# Patient Record
Sex: Female | Born: 1975 | Race: White | Hispanic: No | Marital: Married | Smoking: Never smoker
Health system: Southern US, Community
[De-identification: ages and names within clinical notes are randomized; demographics above are authoritative.]

## PROBLEM LIST (undated history)

## (undated) DIAGNOSIS — H3530 Unspecified macular degeneration: Secondary | ICD-10-CM

## (undated) DIAGNOSIS — Z8616 Personal history of COVID-19: Secondary | ICD-10-CM

## (undated) HISTORY — DX: Unspecified macular degeneration: H35.30

## (undated) HISTORY — DX: Personal history of COVID-19: Z86.16

---
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
## 1999-01-17 ENCOUNTER — Other Ambulatory Visit: Admission: RE | Admit: 1999-01-17 | Discharge: 1999-01-17 | Payer: Self-pay | Admitting: Gynecology

## 1999-08-03 ENCOUNTER — Encounter (INDEPENDENT_AMBULATORY_CARE_PROVIDER_SITE_OTHER): Payer: Self-pay

## 1999-08-03 ENCOUNTER — Inpatient Hospital Stay (HOSPITAL_COMMUNITY): Admission: AD | Admit: 1999-08-03 | Discharge: 1999-08-05 | Payer: Self-pay | Admitting: Gynecology

## 1999-08-06 ENCOUNTER — Encounter: Admission: RE | Admit: 1999-08-06 | Discharge: 1999-09-05 | Payer: Self-pay | Admitting: Gynecology

## 1999-09-15 ENCOUNTER — Other Ambulatory Visit: Admission: RE | Admit: 1999-09-15 | Discharge: 1999-09-15 | Payer: Self-pay | Admitting: Gynecology

## 2000-10-25 ENCOUNTER — Other Ambulatory Visit: Admission: RE | Admit: 2000-10-25 | Discharge: 2000-10-25 | Payer: Self-pay | Admitting: Gynecology

## 2002-01-02 ENCOUNTER — Other Ambulatory Visit: Admission: RE | Admit: 2002-01-02 | Discharge: 2002-01-02 | Payer: Self-pay | Admitting: Gynecology

## 2004-02-13 ENCOUNTER — Other Ambulatory Visit: Admission: RE | Admit: 2004-02-13 | Discharge: 2004-02-13 | Payer: Self-pay | Admitting: Obstetrics and Gynecology

## 2004-05-07 ENCOUNTER — Ambulatory Visit: Payer: Self-pay | Admitting: Family Medicine

## 2004-06-03 ENCOUNTER — Ambulatory Visit: Payer: Self-pay | Admitting: Family Medicine

## 2004-07-15 ENCOUNTER — Encounter: Admission: RE | Admit: 2004-07-15 | Discharge: 2004-07-31 | Payer: Self-pay | Admitting: Obstetrics and Gynecology

## 2004-09-03 ENCOUNTER — Inpatient Hospital Stay (HOSPITAL_COMMUNITY): Admission: AD | Admit: 2004-09-03 | Discharge: 2004-09-03 | Payer: Self-pay | Admitting: Obstetrics and Gynecology

## 2004-09-05 ENCOUNTER — Encounter (INDEPENDENT_AMBULATORY_CARE_PROVIDER_SITE_OTHER): Payer: Self-pay | Admitting: Specialist

## 2004-09-05 ENCOUNTER — Inpatient Hospital Stay (HOSPITAL_COMMUNITY): Admission: AD | Admit: 2004-09-05 | Discharge: 2004-09-08 | Payer: Self-pay | Admitting: *Deleted

## 2004-11-18 ENCOUNTER — Ambulatory Visit: Payer: Self-pay | Admitting: Internal Medicine

## 2005-09-25 ENCOUNTER — Ambulatory Visit: Payer: Self-pay | Admitting: Family Medicine

## 2005-12-23 ENCOUNTER — Other Ambulatory Visit: Admission: RE | Admit: 2005-12-23 | Discharge: 2005-12-23 | Payer: Self-pay | Admitting: Obstetrics and Gynecology

## 2006-06-17 ENCOUNTER — Ambulatory Visit: Payer: Self-pay | Admitting: Family Medicine

## 2006-06-18 ENCOUNTER — Ambulatory Visit: Payer: Self-pay | Admitting: Family Medicine

## 2006-06-18 ENCOUNTER — Encounter: Payer: Self-pay | Admitting: Family Medicine

## 2007-05-31 ENCOUNTER — Encounter (INDEPENDENT_AMBULATORY_CARE_PROVIDER_SITE_OTHER): Payer: Self-pay | Admitting: Obstetrics and Gynecology

## 2007-05-31 ENCOUNTER — Ambulatory Visit (HOSPITAL_COMMUNITY): Admission: RE | Admit: 2007-05-31 | Discharge: 2007-05-31 | Payer: Self-pay | Admitting: Obstetrics and Gynecology

## 2009-02-20 ENCOUNTER — Ambulatory Visit: Payer: Self-pay | Admitting: Family Medicine

## 2010-04-21 ENCOUNTER — Emergency Department: Payer: Self-pay | Admitting: Emergency Medicine

## 2010-07-29 NOTE — Op Note (Signed)
NAMEADRIAN, Janet Lucas              ACCOUNT NO.:  [id number]   MEDICAL RECORD NO.:  [id number]          PATIENT TYPE:  AMB   LOCATION:  SDC                           [facility]   PHYSICIAN:  Osborn Coho, M.D.   DATE OF BIRTH:  Jul 12, 1975   DATE OF PROCEDURE:  DATE OF DISCHARGE:                               OPERATIVE REPORT   PREOPERATIVE DIAGNOSIS:  Menometrorrhagia.   POSTOPERATIVE DIAGNOSIS:  Menometrorrhagia.   PROCEDURES:  1. Hysteroscopy.  2. Dilatation and curettage.  3. NovaSure endometrial ablation.   ATTENDING:  Osborn Coho, M.D.   ANESTHESIA:  General via LMA.   FINDINGS:  Polypoid tissue.   SPECIMENS TO PATHOLOGY:  Endometrial curettings.  Frozen section sent  and pathology report was benign with secretary endometrium.  Permanent  sections sent as well and still pending.   FLUIDS:  500 mL   URINE OUTPUT:  Quantity sufficient via straight catheter prior to  procedure.   ESTIMATED BLOOD LOSS:  Minimal.   COMPLICATIONS:  None.   PROCEDURE:  The patient was taken to the operating room after the risks,  benefits and alternatives reviewed with the patient, the patient  verbalized understanding and consent signed and witnessed.  The patient  was placed under general anesthesia and prepped and draped in the normal  sterile fashion.  A bivalve speculum was placed in the patient's vagina  and the anterior lip of the cervix grasped with single-tooth tenaculum.  A paracervical block was administered using a total of 10 mL of 1%  lidocaine.  The cervical length measured 5 cm and the uterus sounded to  10 cm.  The cervix was dilated for passage of the hysteroscope,  hysteroscope introduced, and no obvious intracavitary lesions noted.  Curettage was performed and several polypoid-appearing pieces of tissue  were removed and sent to pathology.  Once frozen section was obtained,  NovaSure ablation was performed and again the uterine sound length was  10 cm, the  cervical length was 5 cm, the cavity length was 5 cm, the  cavity width was 4.7 cm.  The power was 129 watts and ablation was  performed for 90 seconds.  The cervix had been dilated for passage of  the  NovaSure instrument and after ablation, hysteroscope was reintroduced  and good results noted.  All instruments were removed and there was good  hemostasis at the tenaculum site.  Count was correct.  The patient  tolerated the procedure well and was awaiting transfer to the recovery  room in good condition.      Osborn Coho, M.D.  Electronically Signed     AR/MEDQ  D:  05/31/2007  T:  05/31/2007  Job:  [id number]

## 2010-08-01 NOTE — H&P (Signed)
Janet Lucas, Janet Lucas              ACCOUNT NO.:  [id number]   MEDICAL RECORD NO.:  [id number]          PATIENT TYPE:  MAT   LOCATION:  MATC                          [facility]  [facility]   PHYSICIAN:  Janine Limbo, M.D.DATE OF BIRTH:  June 18, 1975   DATE OF ADMISSION:  09/05/2004  DATE OF DISCHARGE:                                HISTORY & PHYSICAL   [name] is a 35 year old, gravida 2, para 1-0-0-1, at 38-2/7th weeks who  presents in labor.  The patient is scheduled for an elective primary  cesarean section, on September 08, 2004, secondary to difficult first labor, LGA  infant, and third degree laceration.  The patient reports onset of labor  approximately 4:30 p.m. this afternoon.  Her cervix has been 2-cm in the  office.   Her pregnancy has been remarkable for:  1.  Insulin-dependent gestational diabetes.  2.  Positive Group B strep.  3.  Planned elective cesarean section secondary to difficult labor and      delivery.  4.  History of LGA infant.   PRENATAL LABS:  Blood type is A positive.  Rh antibody negative.  VDRL  nonreactive.  Rubella titer positive.  Hepatitis B surface antigen negative.  HIV is declined.  GC and Chlamydia cultures were negative in November.  Platelet count was 328 at her new OB visit, hemoglobin upon entry into  practice was 12.5.  It was 11.7 at 28 weeks.  The patient had an elevated 1  hour Glucola of 163.  She had an abnormal 3-hour GTT and was diagnosed with  gestational diabetes at approximately 30 weeks.  She was diet-controlled for  a time but then began to take NPH at bedtime.  She then was added an NPH  dose at lunch and her evening dose was also increased.  Most recent sonogram  showed estimated fetal weight of 7.5 pounds on approximately September 01, 2004.   In reviewing delivery options with the patient, Dr. Estanislado Pandy had discussed  with the patient history of an LGA infant with forceps delivery, shoulder  dystocia, and a third degree laceration.   The patient elected to proceed  with a scheduled primary cesarean section.  She had been 2-cm seen in the  office earlier this week and reports increased contractions today.  She  denies any leaking of fluid.  EDC of September 16, 2004, was established last  menstrual period and was in agreement with ultrasound at approximately 18  weeks.  The patient has been managed with NSTs twice weekly during the later  stage of her pregnancy.   OBSTETRICAL HISTORY:  In 2001, she had a vaginal birth of a female infant,  weighed 8 pounds 14 ounces at 40 weeks' gestation.  She was in labor 15  hours.  She had epidural anesthesia.  She had a prolonged second stage,  forceps delivery, meconium aspiration, third degree laceration with vaginal  pain persisting, and moderate shoulder dystocia.   MEDICAL HISTORY:  1.  The patient is unsure of some type of infection postpartum but was      probably bacterial vaginosis.  2.  She was on Ortho Tri-Cyclen before her first child and then had to use      withdrawal method.  3.  She reports the usual childhood illnesses.  4.  She has had wisdom teeth removed in 2003.  5.  She had a cyst on her right breast in the past.   She denies any allergies.   FAMILY HISTORY:  Her maternal grandmother had an MI and is now deceased.  Her father had an MI and is deceased.  The patient's father had [facility] Spotted Fever with neurological complications, diagnosed with  bipolar and schizophrenia subsequent to [facility] Fever.  He  committed suicide.  Her father also had hypertension.  Her maternal  grandmother had varicosities.  Mother had anemia.  The patient also had  anemia with pregnancy.  Paternal aunt, paternal cousin had diabetes.  Father  had questionable Parkinson's but there is some question of medication  induced.  Maternal aunt had lung cancer.  Mother had cervical cancer and had  a hysterectomy.  Father was diagnosed, at age 50, with bipolar and   schizophrenia, after a [facility] Spotted Fever tick bite.  Her son has  severe asthma.   GENETIC HISTORY:  Remarkable for the patient's nephew born with a missing  thumb and a half an esophagus.  The patient is a twin, brother is a twin,  maternal uncles are twins, maternal grandmother is a twin, and paternal  cousins are twins.   SOCIAL HISTORY:  The patient is married to the father of the baby.  He is  involved and supportive.  His name is Ludd Harrison.  The patient is college  educated.  She is a Systems analyst.  Her husband has a high school  education.  He is employed with a trucking company.  She has been followed  originally by the Certified Nurse Midwife service but then was transferred  to the Physician's service secondary to gestational diabetes.  She denies  any alcohol, drug, or tobacco use during this pregnancy.  She is Caucasian  of the [place] faith.   PHYSICAL EXAMINATION:  VITAL SIGNS:  Stable.  The patient is afebrile.  HEENT:  Within normal limits.  LUNGS:  Breath sounds are clear.  HEART:  Regular rate and rhythm without murmur.  BREASTS:  Soft and nontender.  ABDOMEN:  Fundal height is approximately 38-cm.  Estimated fetal weight is 7-  8 pounds.  Uterine contractions are every 3-4 minutes, moderate quality.  PELVIC:  Cervical exam 4-cm, 100%, vertex, at -1 station with an intact bag  of water.  Fetal heart rate is reassuring with no decelerations.  EXTREMITIES:  Deep tendon reflexes are 2+ without clonus.  There is a trace  edema noted.   IMPRESSION:  1.  Intrauterine pregnancy at 38-4/7th weeks.  2.  Scheduled for cesarean section, on September 08, 2004, secondary to a history      of difficult delivery.  3.  Early labor.  4.  Positive Group B strep.  5.  Insulin-dependent gestational diabetic.  6.  Desires tubal sterilization.   PLAN:  1.  Admit to the [facility] for a consult with Dr.      Janine Limbo as attending      physician.  2.  Routine preoperative orders.  3.  Check comprehensive metabolic panel, CBC  , RPR.       VLL/MEDQ  D:  09/05/2004  T:  09/05/2004  Job:  [id number] 

## 2010-08-01 NOTE — Op Note (Signed)
Janet Lucas              ACCOUNT NO.:  [id number]   MEDICAL RECORD NO.:  [id number]          PATIENT TYPE:  INP   LOCATION:  9101                          [facility]ACILITY:  [facility]   PHYSICIAN:  Janine Limbo, M.D.DATE OF BIRTH:  [birth year]   DATE OF PROCEDURE:  09/05/2004  DATE OF DISCHARGE:                                 OPERATIVE REPORT   PREOPERATIVE DIAGNOSES:  1.  Term intrauterine pregnancy.  2.  History of a macrosomic infant with shoulder dystocia and meconium      aspiration.  3.  Desires sterilization.  4.  Gestational diabetes.   POSTOPERATIVE DIAGNOSIS:  1.  Term intrauterine pregnancy.  2.  History of a macrosomic infant with shoulder dystocia and meconium      aspiration.  3.  Desires sterilization.  4.  Gestational diabetes.  5.  Meconium-stained amniotic fluid.   PROCEDURES:  1.  Primary low transverse cesarean section.  2.  Bilateral tubal ligation.   SURGEON:  Janine Limbo, M.D.   FIRST ASSISTANT:  Nigel Bridgeman, C.N.M.   ANESTHETIC:  Spinal.   DISPOSITION:  Janet Lucas is a 35 year old female, gravida 2, para 1-0-0-1,  who presents with the above-mentioned diagnoses.  The patient understands  the indications for her surgical procedure and she accepts the risk of, but  not limited to, anesthetic complications, bleeding, infection, possible  damage to surrounding organs, and possible tubal failure (17 per 1000).   FINDINGS:  A 7 pounds 7 ounce female infant [employer])  was delivered from a  cephalic presentation.  The apparatus for 8 at one minute and nine at five .  The fallopian tubes, the ovaries, and the uterus were normal for the gravid  state.  Meconium fluid was noted, but no meconium was noted beneath the  vocal cords.   PROCEDURE:  The patient was taken to the operating room, where a spinal  anesthetic was given.  The patient's abdomen and perineum were prepped with  multiple layers of Betadine.  A Foley catheter was placed in the  bladder.  The patient was then sterilely draped.  The lower abdomen was injected with  20 mL of 0.5% Marcaine with epinephrine.  A low transverse incision was made  and carried sharply through the subcutaneous tissue, the fascia, and the  anterior peritoneum.  The bladder flap was developed.  An incision was made  in the lower uterine segment and extended transversely.  Meconium fluid was  noted.  The fetal head was delivered.  The infant's mouth and nose were suctioned  using the DeLee trap.  The remainder of the infant was then delivered.  The  cord was clamped and cut and the infant was handed to the waiting pediatric  team.  Routine cord blood studies were obtained.  The placenta was removed.  The uterine cavity was cleaned of amniotic fluid, clotted blood, and  membranes.  The uterine incision was closed using a running locking suture  of 2-0 Vicryl followed by an imbricating suture of 2-0 Vicryl.  Hemostasis was adequate.  The left fallopian tube was identified and  followed  to its fimbriated end.  A knuckle of tube was made on the left  using a free tie of 0 plain catgut.  A suture ligature of 0 plain catgut was  then placed.  The knuckle of tube thus made was excised.  Hemostasis was  adequate.  An identical procedure was carried out on the opposite side.  Again hemostasis was adequate.  All instruments were then removed.  The  anterior peritoneum was closed using a running suture of 2-0 Vicryl.  The  abdominal musculature was reapproximated in the midline using 2-0 Vicryl.  The fascia, the subcutaneous tissue, and abdominal musculature were  irrigated.  Hemostasis was adequate.  The fascia was closed using a running  suture of 0 Vicryl followed by three interrupted sutures of 0 Vicryl.  A  Jackson-Pratt drain was placed in the subcutaneous layer and brought out  through the left lower quadrant.  It was sutured into place using 3-0 silk.  The subcutaneous layer was closed using  interrupted sutures of 0 Vicryl.  The skin was reapproximated using a subcuticular suture of 4-0 Monocryl.  Sponge, needle, and instrument counts were correct on two occasions.  The  estimated blood loss for the procedure was 800 mL.  The patient tolerated  her procedure well.  The patient was taken to the recovery room in stable  condition.  The infant was taken to the full-term nursery in stable  condition.       AVS/MEDQ  D:  09/05/2004  T:  09/06/2004  Job:  [id number]

## 2010-08-01 NOTE — Discharge Summary (Signed)
Janet Lucas, Janet Lucas              ACCOUNT NO.:  [id number]   MEDICAL RECORD NO.:  [id number]          PATIENT TYPE:  INP   LOCATION:  9101                          [facility]   PHYSICIAN:  Janine Limbo, M.D.DATE OF BIRTH:  Feb 06, 1976   DATE OF ADMISSION:  09/05/2004  DATE OF DISCHARGE:  09/08/2004                                 DISCHARGE SUMMARY   ADMITTING DIAGNOSES:  1.  Intrauterine pregnancy at term.  2.  History of macrosomia with shoulder dystocia.  3.  Desired sterilization.  4.  Gestational diabetes.   DISCHARGE DIAGNOSES:  1.  Intrauterine pregnancy at term.  2.  History of macrosomia with shoulder dystocia.  3.  Desired sterilization.  4.  Gestational diabetes.  5.  Meconium stained fluid.  6.  Status post cesarean delivery of a viable female infant named Samera      weighing 7 pounds 7 ounces, Apgars 8 and 9.   PROCEDURES:  1.  Spinal anesthesia.  2.  Primary low transverse cesarean section.  3.  Elective bilateral tubal ligation.   HOSPITAL COURSE:  Patient was admitted for evaluation of labor.  She had had  a cesarean section planned for June 26 and therefore cesarean section was  undertaken on date of admission.  This was done under spinal anesthesia.  She also had a bilateral tubal ligation at the same time.  EBL was 800 mL.  Dr. Stefano Gaul was surgeon.  On postoperative day #1 patient was doing well,  taking clear liquids and voiding well after her Foley was removed.  She did  have some hemorrhoid pain.  Hemoglobin was 11.6.  On postoperative day #2  patient continued to improve.  She received routine postoperative care.  Blood sugars ranged 91-111 on 6 units of NPH insulin at bedtime.  On  postoperative day #3 patient was doing well.  Vital signs were stable.  She  was afebrile.  Chest was clear.  Heart rate regular rate and rhythm.  Abdomen was soft and appropriately tender.  Incision was clean, dry, and  intact.  The JP drain was draining only 10  mL in the last 24 hours and was  removed without difficulty.  Lochia was small.  Extremities within normal  limits.  Patient was deemed to have received the full benefit of her  hospital stay and was discharged home.   DISCHARGE MEDICATIONS:  1.  Motrin 600 mg one p.o. q.6h. p.r.n.  2.  Tylox one to two p.o. q.4h. p.r.n.  3.  NPH insulin as directed by physician.  4.  Prenatal vitamins.   DISCHARGE LABORATORIES:  Chemistries were within normal limits.  White blood  cell count 13.4, hemoglobin 11.6, platelet count 180.  RPR nonreactive.   DISCHARGE INSTRUCTIONS:  Per [facility] handout.  Discharge follow-up in six weeks  or p.r.n.   CONDITION ON DISCHARGE:  Good.       MLW/MEDQ  D:  09/08/2004  T:  09/08/2004  Job:  [id number]

## 2010-08-01 NOTE — Discharge Summary (Signed)
[facility]  Patient:    Janet Lucas, Janet Lucas                     MRN: [id number] Adm. Date:  [id number] Disc. Date: [id number] Attending:  Douglass Rivers Dictator:   Antony Contras, RNC, [facility]                           Discharge Summary  DISCHARGE DIAGNOSES:          1. Intrauterine pregnancy at 40 weeks.                               2. Delivery of a viable infant.  PROCEDURE:                    Low forceps vaginal delivery, repair of third degree laceration.  Midline episiotomy with third degree laceration.  HISTORY OF PRESENT ILLNESS:   The patient is a 35 year old primigravida with an LMP of October 27, 1998, [facility] Aug 03, 1999.  Prenatal course was uncomplicated.  PRENATAL LABORATORY DATA:     Blood type A positive, rubella immune.  HIV, HBSAG, and RPR all nonreactive.  MSAFP within normal limits.  GBS negative.  HOSPITAL COURSE:              The patient was admitted on Aug 03, 1999, with onset of spontaneous labor.  Cervix on admission was 2 cm, 90%, -2.  Artificial rupture of membranes revealed some thin meconium fluid.  The patient did progress to complete dilatation.  She did have a prolonged second stage.  The fetus was successfully rotated and delivered over low forceps with a midline episiotomy.  Female infant, Apgars 8 and 9, birth weight 8 pounds 14 ounces.  The patient also  sustained a third degree laceration.  Her postpartum course was normal.  She remained afebrile and had no difficulty voiding.  She did have some hemorrhoids  which were not thrombosed and did become less swollen as her postpartum course progressed.  She was able to be discharged on her second postpartum day in satisfactory condition.  DISPOSITION:                  Follow up in six weeks in the office.  Continue prenatal vitamins, iron, Motrin, and Tylox for pain. DD:  08/15/99 TD:  08/18/99 Job: [id number]

## 2010-12-08 LAB — CBC
HCT: 37.7
MCV: 93.6
Platelets: 347
WBC: 7.4

## 2015-03-15 ENCOUNTER — Encounter: Payer: Self-pay | Admitting: Internal Medicine

## 2015-05-14 ENCOUNTER — Ambulatory Visit: Payer: Self-pay | Admitting: Internal Medicine

## 2016-05-11 ENCOUNTER — Other Ambulatory Visit: Payer: Self-pay | Admitting: Obstetrics & Gynecology

## 2016-05-11 DIAGNOSIS — Z1231 Encounter for screening mammogram for malignant neoplasm of breast: Secondary | ICD-10-CM

## 2016-06-11 ENCOUNTER — Ambulatory Visit: Payer: Self-pay

## 2016-07-06 ENCOUNTER — Ambulatory Visit
Admission: RE | Admit: 2016-07-06 | Discharge: 2016-07-06 | Disposition: A | Discharge status: Home / Self Care | Payer: BLUE CROSS/BLUE SHIELD | Source: Ambulatory Visit | Attending: Obstetrics & Gynecology | Admitting: Obstetrics & Gynecology

## 2016-07-06 ENCOUNTER — Encounter: Payer: Self-pay | Admitting: Radiology

## 2016-07-06 ENCOUNTER — Encounter: Payer: Self-pay | Admitting: Obstetrics & Gynecology

## 2016-07-06 DIAGNOSIS — Z1231 Encounter for screening mammogram for malignant neoplasm of breast: Secondary | ICD-10-CM | POA: Diagnosis not present

## 2018-01-13 DIAGNOSIS — J029 Acute pharyngitis, unspecified: Secondary | ICD-10-CM | POA: Diagnosis not present

## 2019-07-25 DIAGNOSIS — H35361 Drusen (degenerative) of macula, right eye: Secondary | ICD-10-CM | POA: Diagnosis not present

## 2019-07-25 DIAGNOSIS — H5213 Myopia, bilateral: Secondary | ICD-10-CM | POA: Diagnosis not present

## 2019-09-12 ENCOUNTER — Ambulatory Visit (INDEPENDENT_AMBULATORY_CARE_PROVIDER_SITE_OTHER): Payer: BC Managed Care – PPO | Admitting: Adult Health

## 2019-09-12 ENCOUNTER — Other Ambulatory Visit: Payer: Self-pay

## 2019-09-12 ENCOUNTER — Encounter: Payer: Self-pay | Admitting: Adult Health

## 2019-09-12 ENCOUNTER — Ambulatory Visit
Admission: RE | Admit: 2019-09-12 | Discharge: 2019-09-12 | Disposition: A | Discharge status: Home / Self Care | Payer: BC Managed Care – PPO | Attending: Adult Health | Admitting: Adult Health

## 2019-09-12 ENCOUNTER — Ambulatory Visit
Admission: RE | Admit: 2019-09-12 | Discharge: 2019-09-12 | Disposition: A | Discharge status: Home / Self Care | Payer: BC Managed Care – PPO | Source: Ambulatory Visit | Attending: Adult Health | Admitting: Adult Health

## 2019-09-12 ENCOUNTER — Other Ambulatory Visit: Payer: Self-pay | Admitting: Adult Health

## 2019-09-12 VITALS — BP 104/70 | HR 81 | Temp 98.2°F | Resp 16 | Wt 179.0 lb

## 2019-09-12 DIAGNOSIS — Z0000 Encounter for general adult medical examination without abnormal findings: Secondary | ICD-10-CM

## 2019-09-12 DIAGNOSIS — R0602 Shortness of breath: Secondary | ICD-10-CM | POA: Insufficient documentation

## 2019-09-12 DIAGNOSIS — R1032 Left lower quadrant pain: Secondary | ICD-10-CM

## 2019-09-12 DIAGNOSIS — Z1231 Encounter for screening mammogram for malignant neoplasm of breast: Secondary | ICD-10-CM

## 2019-09-12 DIAGNOSIS — M79645 Pain in left finger(s): Secondary | ICD-10-CM | POA: Diagnosis not present

## 2019-09-12 DIAGNOSIS — Z9889 Other specified postprocedural states: Secondary | ICD-10-CM

## 2019-09-12 DIAGNOSIS — L812 Freckles: Secondary | ICD-10-CM

## 2019-09-12 DIAGNOSIS — Z8616 Personal history of COVID-19: Secondary | ICD-10-CM

## 2019-09-12 DIAGNOSIS — K047 Periapical abscess without sinus: Secondary | ICD-10-CM

## 2019-09-12 DIAGNOSIS — R079 Chest pain, unspecified: Secondary | ICD-10-CM | POA: Diagnosis not present

## 2019-09-12 DIAGNOSIS — R0789 Other chest pain: Secondary | ICD-10-CM

## 2019-09-12 DIAGNOSIS — Z1389 Encounter for screening for other disorder: Secondary | ICD-10-CM | POA: Diagnosis not present

## 2019-09-12 DIAGNOSIS — M79642 Pain in left hand: Secondary | ICD-10-CM

## 2019-09-12 DIAGNOSIS — M79641 Pain in right hand: Secondary | ICD-10-CM | POA: Insufficient documentation

## 2019-09-12 DIAGNOSIS — Z13220 Encounter for screening for lipoid disorders: Secondary | ICD-10-CM | POA: Diagnosis not present

## 2019-09-12 DIAGNOSIS — E559 Vitamin D deficiency, unspecified: Secondary | ICD-10-CM

## 2019-09-12 DIAGNOSIS — K5900 Constipation, unspecified: Secondary | ICD-10-CM | POA: Diagnosis not present

## 2019-09-12 DIAGNOSIS — L989 Disorder of the skin and subcutaneous tissue, unspecified: Secondary | ICD-10-CM

## 2019-09-12 MED ORDER — AMOXICILLIN-POT CLAVULANATE 875-125 MG PO TABS: 1.0000 | ORAL_TABLET | Freq: Two times a day (BID) | ORAL | 0 refills | Status: AC

## 2019-09-12 NOTE — Progress Notes (Addendum)
In office visit.       Patient: Janet Lucas   DOB: [birth year]   44 y.o. Female  MRN: [id number] Visit Date: 09/12/2019  Today's healthcare provider: Jairo Ben, FNP   Chief Complaint  Patient presents with  . New Patient (Initial Visit)   Subjective    HPI Patient reports she had Covid in November 2020, she had been taking care of her mom and son as well with Covid. Her entire family had Covid.  She reports since she had covid she still can not smell, she has been having shortness of breath with walking and up hills only since covid  and chest pains. She reports she notices chest pain after coughing. Denies any neck, aem or jaw pain. Fatigue sioince covid only.   She does have heartburn and it has been increased since covid.   Prior to Covid she was very active. She has gained 20 lbs since covid pandemic started.   She had a fainting spell x 1 while sitting on the toilet and getting ready to stand this was once only after having bowel movement.  She occasionally has diarrhea with dieting.  No recent syncope or lightheadedness/ dizziness.    Left side lower abdominal pain. Denies any bloody diarrhea.   Left side of face - has an area present for 6 months she would like checked.   She has been having pain in her left hand worse that runs from her pinky finger.  She also notices it in her her right hand. She is a principal at school. No recent typing. Only since november.   She wants to have PAP here - she has LLQ pain. History of polyps benign- removed from uterus 10 plus years ago. Ablation 12 years ago.    Patient  denies any fever,chills, rash, chest pain, nausea, vomiting.    Patient Active Problem List   Diagnosis Date Noted  . Lesion of skin of face 09/13/2019  . Freckles 09/13/2019  . Rheumatoid factor positive 09/13/2019  . Chest pain 09/12/2019  . History of endometrial ablation 09/12/2019  . Dental infection 09/12/2019  . Screening for  blood or protein in urine 09/12/2019  . LLQ pain 09/12/2019  . Vitamin D insufficiency 09/12/2019  . Bilateral hand pain 09/12/2019  . Finger pain, left 09/12/2019  . Shortness of breath 09/12/2019  . History of COVID-19 09/12/2019   Past Medical History:  Diagnosis Date  . History of COVID-19   . Macular degeneration, bilateral    Past Surgical History:  Procedure Laterality Date  . CESAREAN SECTION     Social History   Tobacco Use  . Smoking status: Never Smoker  . Smokeless tobacco: Former Engineer, water Use Topics  . Alcohol use: Not Currently  . Drug use: Never   Social History   Socioeconomic History  . Marital status: Married    Spouse name: Not on file  . Number of children: Not on file  . Years of education: Not on file  . Highest education level: Not on file  Occupational History  . Not on file  Tobacco Use  . Smoking status: Never Smoker  . Smokeless tobacco: Former Engineer, water and Sexual Activity  . Alcohol use: Not Currently  . Drug use: Never  . Sexual activity: Yes  Other Topics Concern  . Not on file  Social History Narrative  . Not on file   Social Determinants of Health   Financial Resource  Strain:   . Difficulty of Paying Living Expenses:   Food Insecurity:   . Worried About Programme researcher, broadcasting/film/video in the Last Year:   . Barista in the Last Year:   Transportation Needs:   . Freight forwarder (Medical):   Marland Kitchen Lack of Transportation (Non-Medical):   Physical Activity:   . Days of Exercise per Week:   . Minutes of Exercise per Session:   Stress:   . Feeling of Stress :   Social Connections:   . Frequency of Communication with Friends and Family:   . Frequency of Social Gatherings with Friends and Family:   . Attends Religious Services:   . Active Member of Clubs or Organizations:   . Attends Banker Meetings:   Marland Kitchen Marital Status:   Intimate Partner Violence:   . Fear of Current or Ex-Partner:   .  Emotionally Abused:   Marland Kitchen Physically Abused:   . Sexually Abused:    Family Status  Relation Name Status  . Mother  (Not Specified)  . Father  (Not Specified)  . Son  (Not Specified)  . Mat Aunt  Deceased  . Mat Uncle  Deceased  . Emelda Brothers  (Not Specified)  . MGM  (Not Specified)  . MGF  (Not Specified)   Family History  Problem Relation Age of Onset  . Skin cancer Mother   . Diabetes Mother   . Osteoporosis Mother   . Heart attack Father   . Asthma Son   . Lymphoma Son   . Lung cancer Maternal Aunt   . Prostate cancer Maternal Uncle   . Diabetes Paternal Aunt   . Heart attack Maternal Grandmother   . Stomach cancer Maternal Grandfather    No Known Allergies    Medications: No outpatient medications prior to visit.   No facility-administered medications prior to visit.    Review of Systems  Constitutional: Positive for fatigue. Negative for activity change, appetite change, chills, diaphoresis, fever and unexpected weight change.  HENT: Negative.   Eyes: Positive for visual disturbance.  Respiratory: Positive for chest tightness and shortness of breath. Negative for apnea, cough, choking, wheezing and stridor.   Cardiovascular: Negative for chest pain, palpitations and leg swelling.  Gastrointestinal: Positive for abdominal pain. Negative for abdominal distention, anal bleeding, constipation, rectal pain and vomiting.  Musculoskeletal: Positive for arthralgias and myalgias. Negative for gait problem, neck pain and neck stiffness.  Skin: Negative.   Neurological: Positive for syncope (x 1 months ago while on toilet, remembers incident no head injury no episodes since ocured with bowel movement no previous history of syncope or associated symptoms. ). Negative for dizziness, tremors, seizures, facial asymmetry, speech difficulty, weakness, light-headedness, numbness and headaches.  Psychiatric/Behavioral: Negative.   All other systems reviewed and are negative.   Last  CBC Lab Results  Component Value Date   WBC 7.9 09/12/2019   HGB 13.5 09/12/2019   HCT 39.7 09/12/2019   MCV 93 09/12/2019   MCH 31.6 09/12/2019   RDW 12.2 09/12/2019   PLT 355 09/12/2019   Last metabolic panel Lab Results  Component Value Date   GLUCOSE 87 09/12/2019   NA 138 09/12/2019   K 4.3 09/12/2019   CL 100 09/12/2019   CO2 21 09/12/2019   BUN 14 09/12/2019   CREATININE 0.75 09/12/2019   GFRNONAA 98 09/12/2019   GFRAA 113 09/12/2019   CALCIUM 9.4 09/12/2019   PROT 7.7 09/12/2019   ALBUMIN 4.5 09/12/2019  LABGLOB 3.2 09/12/2019   AGRATIO 1.4 09/12/2019   BILITOT 0.6 09/12/2019   ALKPHOS 67 09/12/2019   AST 14 09/12/2019   ALT 12 09/12/2019   Last lipids Lab Results  Component Value Date   CHOL 201 (H) 09/12/2019   HDL 60 09/12/2019   LDLCALC 122 (H) 09/12/2019   TRIG 106 09/12/2019   CHOLHDL 3.4 09/12/2019   Last hemoglobin A1c No results found for: HGBA1C Last thyroid functions Lab Results  Component Value Date   TSH 1.880 09/12/2019   Last vitamin D Lab Results  Component Value Date   VD25OH 22.2 (L) 09/12/2019   Last vitamin B12 and Folate No results found for: VITAMINB12, FOLATE    Objective    BP 104/70   Pulse 81   Temp 98.2 F (36.8 C) (Oral)   Resp 16   Wt 179 lb (81.2 kg)   SpO2 98%  BP Readings from Last 3 Encounters:  09/12/19 104/70      Physical Exam Vitals reviewed.  Constitutional:      General: She is not in acute distress.    Appearance: Normal appearance. She is well-developed. She is not ill-appearing, toxic-appearing or diaphoretic.     Interventions: She is not intubated.    Comments: Patient is alert and oriented and responsive to questions Engages in eye contact with provider. Speaks in full sentences without any pauses without any shortness of breath or distress.   HENT:     Head: Normocephalic and atraumatic.     Right Ear: External ear normal.     Left Ear: External ear normal.     Nose: Nose normal.       Mouth/Throat:     Pharynx: No oropharyngeal exudate.  Eyes:     General: Lids are normal. No scleral icterus.       Right eye: No discharge.        Left eye: No discharge.     Conjunctiva/sclera: Conjunctivae normal.     Right eye: Right conjunctiva is not injected. No exudate or hemorrhage.    Left eye: Left conjunctiva is not injected. No exudate or hemorrhage.    Pupils: Pupils are equal, round, and reactive to light.  Neck:     Thyroid: No thyroid mass or thyromegaly.     Vascular: Normal carotid pulses. No carotid bruit, hepatojugular reflux or JVD.     Trachea: Trachea and phonation normal. No tracheal tenderness or tracheal deviation.     Meningeal: Brudzinski's sign and Kernig's sign absent.  Cardiovascular:     Rate and Rhythm: Normal rate and regular rhythm.     Chest Wall: PMI is not displaced. No thrill.     Pulses: Normal pulses.          Radial pulses are 2+ on the right side and 2+ on the left side.       Dorsalis pedis pulses are 2+ on the right side and 2+ on the left side.       Posterior tibial pulses are 2+ on the right side and 2+ on the left side.     Heart sounds: Normal heart sounds, S1 normal and S2 normal. Heart sounds not distant. No murmur heard.  No friction rub. No gallop.   Pulmonary:     Effort: Pulmonary effort is normal. No tachypnea, bradypnea, accessory muscle usage or respiratory distress. She is not intubated.     Breath sounds: Normal breath sounds and air entry. No stridor or transmitted upper airway  sounds. No decreased breath sounds, wheezing, rhonchi or rales.  Chest:     Chest wall: No tenderness.       Comments: deferred breast exam until next visit with PAP. Abdominal:     General: Bowel sounds are normal. There is no distension or abdominal bruit.     Palpations: Abdomen is soft. There is no shifting dullness, fluid wave, hepatomegaly, splenomegaly, mass or pulsatile mass.     Tenderness: There is abdominal tenderness in the left  lower quadrant. There is no right CVA tenderness, left CVA tenderness, guarding or rebound. Negative signs include Murphy's sign and McBurney's sign.     Hernia: No hernia is present.    Musculoskeletal:        General: No tenderness or deformity. Normal range of motion.     Cervical back: Full passive range of motion without pain, normal range of motion and neck supple. No edema, erythema, rigidity or tenderness. No spinous process tenderness or muscular tenderness. Normal range of motion.     Right lower leg: No edema.     Left lower leg: No edema.  Lymphadenopathy:     Head:     Right side of head: No submental, submandibular, tonsillar, preauricular, posterior auricular or occipital adenopathy.     Left side of head: No submental, submandibular, tonsillar, preauricular, posterior auricular or occipital adenopathy.     Cervical: No cervical adenopathy.     Right cervical: No superficial, deep or posterior cervical adenopathy.    Left cervical: No superficial, deep or posterior cervical adenopathy.     Upper Body:     Right upper body: No supraclavicular or pectoral adenopathy.     Left upper body: No supraclavicular or pectoral adenopathy.  Skin:    General: Skin is warm and dry.     Capillary Refill: Capillary refill takes less than 2 seconds.     Coloration: Skin is not pale.     Findings: Lesion (skin lesion salmon colored raised area approximately 5mm in length x 7 mm left lower jaw ) present. No abrasion, bruising, burn, ecchymosis, erythema, petechiae or rash. Rash is not macular or papular.     Nails: There is no clubbing.  Neurological:     Mental Status: She is alert and oriented to person, place, and time.     GCS: GCS eye subscore is 4. GCS verbal subscore is 5. GCS motor subscore is 6.     Cranial Nerves: No cranial nerve deficit.     Sensory: No sensory deficit.     Motor: No weakness, tremor, atrophy, abnormal muscle tone or seizure activity.     Coordination:  Coordination normal.     Gait: Gait normal.     Deep Tendon Reflexes: Reflexes are normal and symmetric. Reflexes normal. Babinski sign absent on the right side. Babinski sign absent on the left side.     Reflex Scores:      Tricep reflexes are 2+ on the right side and 2+ on the left side.      Bicep reflexes are 2+ on the right side and 2+ on the left side.      Brachioradialis reflexes are 2+ on the right side and 2+ on the left side.      Patellar reflexes are 2+ on the right side and 2+ on the left side.      Achilles reflexes are 2+ on the right side and 2+ on the left side. Psychiatric:  Attention and Perception: Attention and perception normal.        Mood and Affect: Mood and affect normal.        Speech: Speech normal.        Behavior: Behavior normal. Behavior is cooperative.        Thought Content: Thought content normal.        Cognition and Memory: Cognition and memory normal.        Judgment: Judgment normal.        Assessment & Plan    The primary encounter diagnosis was Routine adult health maintenance. Diagnoses of History of COVID-19, Shortness of breath, Finger pain, left, Pain in both hands, Vitamin D insufficiency, Routine health maintenance, Screening cholesterol level, LLQ pain, Screening mammogram, encounter for, Dental infection, Chest pain, unspecified type, History of endometrial ablation, Musculoskeletal chest pain, Lesion of skin of face, Freckles, and Screening for blood or protein in urine were also pertinent to this visit.   Orders Placed This Encounter  Procedures  . DG Chest 2 View    Order Specific Question:   Reason for Exam (SYMPTOM  OR DIAGNOSIS REQUIRED)    Answer:   shortness of breath and chest discomfort since covid    Order Specific Question:   Is patient pregnant?    Answer:   No    Order Specific Question:   Preferred imaging location?    Answer:   [facility]    Order Specific Question:   Radiology Contrast Protocol - do  NOT remove file path    Answer:   \\[email or web address]\DXFluoroContrastProtocols.pdf  . DG Abd 2 Views    Order Specific Question:   Reason for Exam (SYMPTOM  OR DIAGNOSIS REQUIRED)    Answer:   LLQ rule out constipation    Order Specific Question:   Is patient pregnant?    Answer:   No    Order Specific Question:   Preferred imaging location?    Answer:   [facility]    Order Specific Question:   Radiology Contrast Protocol - do NOT remove file path    Answer:   \\[email or web address]\DXFluoroContrastProtocols.pdf  . MM Digital Screening    Order Specific Question:   Reason for Exam (SYMPTOM  OR DIAGNOSIS REQUIRED)    Answer:   screening breast    Order Specific Question:   Is the patient pregnant?    Answer:   No    Order Specific Question:   Preferred imaging location?    Answer:   [facility]  . US Pelvic Complete With Transvaginal    Order Specific Question:   Reason for Exam (SYMPTOM  OR DIAGNOSIS REQUIRED)    Answer:   LLQ pain suspect ovarian cysts, history of benign uterine polyps removed.    Order Specific Question:   Preferred imaging location?    Answer:   [facility]  . Comprehensive Metabolic Panel (CMET)  . TSH  . Lipid Profile  . Rheumatoid factor  . ANA  . VITAMIN D 25 Hydroxy (Vit-D Deficiency, Fractures)  . CBC with Differential/Platelet  . Ambulatory referral to Dermatology    Referral Priority:   Urgent    Referral Type:   Consultation    Referral Reason:   Specialty Services Required    Referred to Provider:   Wanita Chamberlain, MD    Requested Specialty:   Dermatology    Number of Visits Requested:   1  . POCT urinalysis dipstick  . POCT urine pregnancy  . EKG 12-Lead  Records requested. Schedule  Pap and breast exam. Call to schedule mammogram.  Return in about 3 months (around 12/13/2019), or if symptoms worsen or fail to improve, for at any time for any worsening symptoms, Go to Emergency room/ urgent care  if worse.     I discussed the assessment and treatment plan with the patient. The patient was provided an opportunity to ask questions and all were answered. The patient agreed with the plan and demonstrated an understanding of the instructions.  Advised patient call the office or your primary care doctor for an appointment if no improvement within 72 hours or if any symptoms change or worsen at any time  Advised ER or urgent Care if after hours or on weekend. Call 911 for emergency symptoms at any time.Patinet verbalized understanding of all instructions given/reviewed and treatment plan and has no further questions or concerns at this time.    Yearly eye exam recommended. Biannual dental exams.    IBeverely Pace Zeev Deakins, FNP, have reviewed all documentation for this visit. The documentation on 09/14/19 for the exam, diagnosis, procedures, and orders are all accurate and complete.  Jairo Ben, FNP [facility] [phone] (phone) [phone] (fax)  [facility]

## 2019-09-12 NOTE — Patient Instructions (Addendum)
It was a pleasure meeting you.   Walk in for chest x ray and abdominal x ray at [facility] outpatient imaging ok today. Labs ok today since fasting.  You should hear regarding ultrasound and also your refferal to dermatology and if you do not please let me know so we can follow up.    Call to schedule your screening mammogram. Your orders have been placed for your exam.  Let our office know if you have questions, concerns, or any difficulty scheduling.  If normal results then yearly screening mammograms are recommended unless you notice  Changes in your breast then you should schedule a follow up office visit. If abnormal results  Further imaging will be warranted and sooner follow up as determined by the radiologist at the [facility].   [facility] [address] [place], [place] [address]  Main: [phone]       Health Maintenance, Female Adopting a healthy lifestyle and getting preventive care are important in promoting health and wellness. Ask your health care provider about:  The right schedule for you to have regular tests and exams.  Things you can do on your own to prevent diseases and keep yourself healthy. What should I know about diet, weight, and exercise? Eat a healthy diet   Eat a diet that includes plenty of vegetables, fruits, low-fat dairy products, and lean protein.  Do not eat a lot of foods that are high in solid fats, added sugars, or sodium. Maintain a healthy weight Body mass index (BMI) is used to identify weight problems. It estimates body fat based on height and weight. Your health care provider can help determine your BMI and help you achieve or maintain a healthy weight. Get regular exercise Get regular exercise. This is one of the most important things you can do for your health. Most adults should:  Exercise for at least 150 minutes each week. The exercise should increase your heart rate and make you sweat  (moderate-intensity exercise).  Do strengthening exercises at least twice a week. This is in addition to the moderate-intensity exercise.  Spend less time sitting. Even light physical activity can be beneficial. Watch cholesterol and blood lipids Have your blood tested for lipids and cholesterol at 43 years of age, then have this test every 5 years. Have your cholesterol levels checked more often if:  Your lipid or cholesterol levels are high.  You are older than 43 years of age.  You are at high risk for heart disease. What should I know about cancer screening? Depending on your health history and family history, you may need to have cancer screening at various ages. This may include screening for:  Breast cancer.  Cervical cancer.  Colorectal cancer.  Skin cancer.  Lung cancer. What should I know about heart disease, diabetes, and high blood pressure? Blood pressure and heart disease  High blood pressure causes heart disease and increases the risk of stroke. This is more likely to develop in people who have high blood pressure readings, are of African descent, or are overweight.  Have your blood pressure checked: ? Every 3-5 years if you are 45-68 years of age. ? Every year if you are 70 years old or older. Diabetes Have regular diabetes screenings. This checks your fasting blood sugar level. Have the screening done:  Once every three years after age 5 if you are at a normal weight and have a low risk for diabetes.  More often and  at a younger age if you are overweight or have a high risk for diabetes. What should I know about preventing infection? Hepatitis B If you have a higher risk for hepatitis B, you should be screened for this virus. Talk with your health care provider to find out if you are at risk for hepatitis B infection. Hepatitis C Testing is recommended for:  Everyone born from [birth year] through 1964.  Anyone with known risk factors for hepatitis  C. Sexually transmitted infections (STIs)  Get screened for STIs, including gonorrhea and chlamydia, if: ? You are sexually active and are younger than 43 years of age. ? You are older than 43 years of age and your health care provider tells you that you are at risk for this type of infection. ? Your sexual activity has changed since you were last screened, and you are at increased risk for chlamydia or gonorrhea. Ask your health care provider if you are at risk.  Ask your health care provider about whether you are at high risk for HIV. Your health care provider may recommend a prescription medicine to help prevent HIV infection. If you choose to take medicine to prevent HIV, you should first get tested for HIV. You should then be tested every 3 months for as long as you are taking the medicine. Pregnancy  If you are about to stop having your period (premenopausal) and you may become pregnant, seek counseling before you get pregnant.  Take 400 to 800 micrograms (mcg) of folic acid every day if you become pregnant.  Ask for birth control (contraception) if you want to prevent pregnancy. Osteoporosis and menopause Osteoporosis is a disease in which the bones lose minerals and strength with aging. This can result in bone fractures. If you are 39 years old or older, or if you are at risk for osteoporosis and fractures, ask your health care provider if you should:  Be screened for bone loss.  Take a calcium or vitamin D supplement to lower your risk of fractures.  Be given hormone replacement therapy (HRT) to treat symptoms of menopause. Follow these instructions at home: Lifestyle  Do not use any products that contain nicotine or tobacco, such as cigarettes, e-cigarettes, and chewing tobacco. If you need help quitting, ask your health care provider.  Do not use street drugs.  Do not share needles.  Ask your health care provider for help if you need support or information about quitting  drugs. Alcohol use  Do not drink alcohol if: ? Your health care provider tells you not to drink. ? You are pregnant, may be pregnant, or are planning to become pregnant.  If you drink alcohol: ? Limit how much you use to 0-1 drink a day. ? Limit intake if you are breastfeeding.  Be aware of how much alcohol is in your drink. In the [place] one drink equals one 12 oz bottle of beer (355 mL), one 5 oz glass of wine (148 mL), or one 1 oz glass of hard liquor (44 mL). General instructions  Schedule regular health, dental, and eye exams.  Stay current with your vaccines.  Tell your health care provider if: ? You often feel depressed. ? You have ever been abused or do not feel safe at home. Summary  Adopting a healthy lifestyle and getting preventive care are important in promoting health and wellness.  Follow your health care provider's instructions about healthy diet, exercising, and getting tested or screened for diseases.  Follow your health care  provider's instructions on monitoring your cholesterol and blood pressure. This information is not intended to replace advice given to you by your health care provider. Make sure you discuss any questions you have with your health care provider. Document Revised: 02/23/2018 Document Reviewed: 02/23/2018 Elsevier Patient Education  2020 [employer].  Aleve once daily per package instructions for two weeks.   Costochondritis Costochondritis is swelling and irritation (inflammation) of the tissue (cartilage) that connects your ribs to your breastbone (sternum). This causes pain in the front of your chest. Usually, the pain:  Starts gradually.  Is in more than one rib. This condition usually goes away on its own over time. Follow these instructions at home:  Do not do anything that makes your pain worse.  If directed, put ice on the painful area: ? Put ice in a plastic bag. ? Place a towel between your skin and the bag. ? Leave the  ice on for 20 minutes, 2-3 times a day.  If directed, put heat on the affected area as often as told by your doctor. Use the heat source that your doctor tells you to use, such as a moist heat pack or a heating pad. ? Place a towel between your skin and the heat source. ? Leave the heat on for 20-30 minutes. ? Take off the heat if your skin turns bright red. This is very important if you cannot feel pain, heat, or cold. You may have a greater risk of getting burned.  Take over-the-counter and prescription medicines only as told by your doctor.  Return to your normal activities as told by your doctor. Ask your doctor what activities are safe for you.  Keep all follow-up visits as told by your doctor. This is important. Contact a doctor if:  You have chills or a fever.  Your pain does not go away or it gets worse.  You have a cough that does not go away. Get help right away if:  You are short of breath. This information is not intended to replace advice given to you by your health care provider. Make sure you discuss any questions you have with your health care provider. Document Revised: 03/17/2017 Document Reviewed: 06/26/2015 Elsevier Patient Education  2020 [employer].

## 2019-09-12 NOTE — Progress Notes (Signed)
Reassuring normal EKG.

## 2019-09-13 ENCOUNTER — Telehealth: Payer: Self-pay

## 2019-09-13 ENCOUNTER — Other Ambulatory Visit: Payer: Self-pay | Admitting: Adult Health

## 2019-09-13 DIAGNOSIS — R079 Chest pain, unspecified: Secondary | ICD-10-CM

## 2019-09-13 DIAGNOSIS — Z8616 Personal history of COVID-19: Secondary | ICD-10-CM

## 2019-09-13 DIAGNOSIS — E559 Vitamin D deficiency, unspecified: Secondary | ICD-10-CM

## 2019-09-13 DIAGNOSIS — R768 Other specified abnormal immunological findings in serum: Secondary | ICD-10-CM

## 2019-09-13 DIAGNOSIS — R0602 Shortness of breath: Secondary | ICD-10-CM

## 2019-09-13 DIAGNOSIS — L812 Freckles: Secondary | ICD-10-CM | POA: Insufficient documentation

## 2019-09-13 DIAGNOSIS — M79645 Pain in left finger(s): Secondary | ICD-10-CM

## 2019-09-13 DIAGNOSIS — L989 Disorder of the skin and subcutaneous tissue, unspecified: Secondary | ICD-10-CM | POA: Insufficient documentation

## 2019-09-13 DIAGNOSIS — M79642 Pain in left hand: Secondary | ICD-10-CM

## 2019-09-13 LAB — COMPREHENSIVE METABOLIC PANEL
ALT: 12 IU/L (ref 0–32)
AST: 14 IU/L (ref 0–40)
Albumin/Globulin Ratio: 1.4 (ref 1.2–2.2)
Albumin: 4.5 g/dL (ref 3.8–4.8)
Alkaline Phosphatase: 67 IU/L (ref 48–121)
BUN/Creatinine Ratio: 19 (ref 9–23)
BUN: 14 mg/dL (ref 6–24)
Bilirubin Total: 0.6 mg/dL (ref 0.0–1.2)
CO2: 21 mmol/L (ref 20–29)
Calcium: 9.4 mg/dL (ref 8.7–10.2)
Chloride: 100 mmol/L (ref 96–106)
Creatinine, Ser: 0.75 mg/dL (ref 0.57–1.00)
GFR calc Af Amer: 113 mL/min/{1.73_m2} (ref >59)
GFR calc non Af Amer: 98 mL/min/{1.73_m2} (ref >59)
Globulin, Total: 3.2 g/dL (ref 1.5–4.5)
Glucose: 87 mg/dL (ref 65–99)
Potassium: 4.3 mmol/L (ref 3.5–5.2)
Sodium: 138 mmol/L (ref 134–144)
Total Protein: 7.7 g/dL (ref 6.0–8.5)

## 2019-09-13 LAB — POCT URINALYSIS DIPSTICK
Appearance: NORMAL
Bilirubin, UA: NEGATIVE
Blood, UA: NEGATIVE
Glucose, UA: NEGATIVE
Ketones, UA: NEGATIVE
Leukocytes, UA: NEGATIVE
Nitrite, UA: NEGATIVE
Protein, UA: NEGATIVE
Spec Grav, UA: 1.02 (ref 1.010–1.025)
Urobilinogen, UA: 0.2 E.U./dL
pH, UA: 6 (ref 5.0–8.0)

## 2019-09-13 LAB — CBC WITH DIFFERENTIAL/PLATELET
Basophils Absolute: 0.1 10*3/uL (ref 0.0–0.2)
Basos: 1 %
EOS (ABSOLUTE): 0.1 10*3/uL (ref 0.0–0.4)
Eos: 2 %
Hematocrit: 39.7 % (ref 34.0–46.6)
Hemoglobin: 13.5 g/dL (ref 11.1–15.9)
Immature Grans (Abs): 0 10*3/uL (ref 0.0–0.1)
Immature Granulocytes: 0 %
Lymphocytes Absolute: 1.7 10*3/uL (ref 0.7–3.1)
Lymphs: 22 %
MCH: 31.6 pg (ref 26.6–33.0)
MCHC: 34 g/dL (ref 31.5–35.7)
MCV: 93 fL (ref 79–97)
Monocytes Absolute: 0.6 10*3/uL (ref 0.1–0.9)
Monocytes: 7 %
Neutrophils Absolute: 5.4 10*3/uL (ref 1.4–7.0)
Neutrophils: 68 %
Platelets: 355 10*3/uL (ref 150–450)
RBC: 4.27 x10E6/uL (ref 3.77–5.28)
RDW: 12.2 % (ref 11.7–15.4)
WBC: 7.9 10*3/uL (ref 3.4–10.8)

## 2019-09-13 LAB — LIPID PANEL
Chol/HDL Ratio: 3.4 ratio (ref 0.0–4.4)
Cholesterol, Total: 201 mg/dL — ABNORMAL HIGH (ref 100–199)
HDL: 60 mg/dL (ref >39)
LDL Chol Calc (NIH): 122 mg/dL — ABNORMAL HIGH (ref 0–99)
Triglycerides: 106 mg/dL (ref 0–149)
VLDL Cholesterol Cal: 19 mg/dL (ref 5–40)

## 2019-09-13 LAB — RHEUMATOID FACTOR: Rheumatoid fact SerPl-aCnc: 24.5 IU/mL — ABNORMAL HIGH (ref 0.0–13.9)

## 2019-09-13 LAB — TSH: TSH: 1.88 u[IU]/mL (ref 0.450–4.500)

## 2019-09-13 LAB — VITAMIN D 25 HYDROXY (VIT D DEFICIENCY, FRACTURES): Vit D, 25-Hydroxy: 22.2 ng/mL — ABNORMAL LOW (ref 30.0–100.0)

## 2019-09-13 LAB — ANA: Anti Nuclear Antibody (ANA): NEGATIVE

## 2019-09-13 LAB — POCT URINE PREGNANCY: Preg Test, Ur: NEGATIVE

## 2019-09-13 MED ORDER — VITAMIN D (ERGOCALCIFEROL) 1.25 MG (50000 UNIT) PO CAPS: 50000.0000 [IU] | ORAL_CAPSULE | ORAL | 0 refills | Status: AC

## 2019-09-13 NOTE — Telephone Encounter (Signed)
lmtcb oka for [facility] triage to advise patient of message below. KW

## 2019-09-13 NOTE — Progress Notes (Signed)
ANA negative

## 2019-09-13 NOTE — Telephone Encounter (Signed)
Orederd she should herr from scheduling for CT angiogram very soon she can do her hand x rays the same day at radiology those can be walk in.

## 2019-09-13 NOTE — Progress Notes (Signed)
Orders Placed This Encounter  Procedures  . CT Angio Chest W/Cm &/Or Wo Cm    History of Covid 19 shortness of breath persistent with chest tightness, normal x ray of chest, need to rule out pulmonary embolism    Order Specific Question:   If indicated for the ordered procedure, I authorize the administration of contrast media per Radiology protocol    Answer:   Yes    Order Specific Question:   Is patient pregnant?    Answer:   No    Order Specific Question:   Preferred imaging location?    Answer:   [facility]    Order Specific Question:   Call Results- Best Contact Number?    Answer:   [phone]    Order Specific Question:   Radiology Contrast Protocol - do NOT remove file path    Answer:   \\[email or web address]\Radiant\CTProtocols.pdf  . DG Hand Complete Left    Order Specific Question:   Reason for Exam (SYMPTOM  OR DIAGNOSIS REQUIRED)    Answer:   RA positive hand pain    Order Specific Question:   Is patient pregnant?    Answer:   No    Order Specific Question:   Preferred imaging location?    Answer:   [facility]    Order Specific Question:   Radiology Contrast Protocol - do NOT remove file path    Answer:   \\[email or web address]s.pdf  . DG Hand Complete Right    Order Specific Question:   Reason for Exam (SYMPTOM  OR DIAGNOSIS REQUIRED)    Answer:   RA positive hand pain    Order Specific Question:   Is patient pregnant?    Answer:   No    Order Specific Question:   Preferred imaging location?    Answer:   [facility]    Order Specific Question:   Radiology Contrast Protocol - do NOT remove file path    Answer:   \\[email or web address]\[email or web address]\DXFluoroContrastProtocols.[email or web address]    Shortness of breath - Plan: CT Angio Chest W/Cm &/Or Wo Cm  Chest pain, unspecified type - Plan: CT Angio Chest W/Cm &/Or Wo Cm  Finger pain, left - Plan: DG Hand Complete Left, DG Hand Complete Right  Bilateral hand pain - Plan: DG  Hand Complete Left, DG Hand Complete Right  Rheumatoid factor positive - Plan: DG Hand Complete Left, DG Hand Complete Right  History of COVID-19 - Plan: CT Angio Chest W/Cm &/Or Wo Cm

## 2019-09-13 NOTE — Telephone Encounter (Signed)
-----   Message from Berniece Pap, FNP sent at 09/13/2019 11:32 AM EDT ----- CBC within normal limits no signs of anemia, or infection.  CMP glucose normal 87, kidney and liver function within normal limits.  TSH for thyroid within normal limits.  Total cholesterol elevated however her HDL's good cholesterol is 60 so this increases total. LDL mile elevation at 122 like less than 100.  Discuss lifestyle modification, increased activity/exercise  with patient e.g. increase exercise, fiber, fruits, vegetables, lean meat, and omega 3/fish intake and decrease saturated fat,   Rheumatoid factor is elevated, since she has hand pain both hands would like to image those joints if she is willing with x ray both hands/ wrist to check joint spacing . May need to consider rheumatology in future if persistent.   Vitamin  D is low, recommend Vitamin D at 50,000 units once weekly by mouth for 10 weeks then start over the counter Vitamin D 3 at 4,000 international units by mouth once daily. Recheck Vitamin D level in 3 to 4 months advised.   Abdominal x ray shows some mild constipation, increase fiber and water in diet and also may add Metamucil or natural psyllium husks mixed with 8 oz of liquid and drink before bed daily if she would like to try.   Chest xray is completely normal, lungs are both clear, and skeletal structures within normal and heart size appears normal. Given her persistent shortness of breath I recommend we do a CT angiogram to rule out post covid blood clot if she is ok with that. If that test is negative we may need to proceed with Echocardiogram. Verify if any contrast dye allergy ? And if she is ok with me ordering CT angiogram with and without contrast and bilateral hand x ray's ?

## 2019-09-13 NOTE — Progress Notes (Signed)
CBC within normal limits no signs of anemia, or infection.  CMP glucose normal 87, kidney and liver function within normal limits.  TSH for thyroid within normal limits.  Total cholesterol elevated however her HDL's good cholesterol is 60 so this increases total. LDL mile elevation at 122 like less than 100.  Discuss lifestyle modification, increased activity/exercise  with patient e.g. increase exercise, fiber, fruits, vegetables, lean meat, and omega 3/fish intake and decrease saturated fat,   Rheumatoid factor is elevated, since she has hand pain both hands would like to image those joints if she is willing with x ray both hands/ wrist to check joint spacing . May need to consider rheumatology in future if persistent.   Vitamin  D is low, recommend Vitamin D at 50,000 units once weekly by mouth for 10 weeks then start over the counter Vitamin D 3 at 4,000 international units by mouth once daily. Recheck Vitamin D level in 3 to 4 months advised.   Abdominal x ray shows some mild constipation, increase fiber and water in diet and also may add Metamucil or natural psyllium husks mixed with 8 oz of liquid and drink before bed daily if she would like to try.   Chest xray is completely normal, lungs are both clear, and skeletal structures within normal and heart size appears normal. Given her persistent shortness of breath I recommend we do a CT angiogram to rule out post covid blood clot if she is ok with that. If that test is negative we may need to proceed with Echocardiogram. Verify if any contrast dye allergy ? And if she is ok with me ordering CT angiogram with and without contrast and bilateral hand x ray's ?

## 2019-09-13 NOTE — Telephone Encounter (Signed)
Patient given all results below as noted by [name], FNP on 09/13/19, she verbalized understanding. She is agreeable to the xrays and CT angio. She says she's not allergic to IVP dye.

## 2019-09-13 NOTE — Progress Notes (Deleted)
     Established patient visit   Patient: Janet Lucas   DOB: 07-06-1975   44 y.o. Female  MRN: [id number] Visit Date: 09/14/2019  Today's healthcare provider: Jairo Ben, FNP   No chief complaint on file.  Baker Pierini Tammy Ericsson,acting as a Neurosurgeon for [employer], FNP.,have documented all relevant documentation on the behalf of Jairo Ben, FNP,as directed by  Jairo Ben, FNP while in the presence of Jairo Ben, FNP.  Subjective    HPI  Follow up for SOB:  The patient was last seen for this 2 days ago. Changes made at last visit include X Ray ordered results were normal. Started Augmentin.  She reports {excellent/good/fair/poor:19665} compliance with treatment. She feels that condition is {improved/worse/unchanged:3041574}. She {is/is not:[id number]} having side effects. ***  -----------------------------------------------------------------------------------------    {Show patient history (optional):23778::" "}   Medications: Outpatient Medications Prior to Visit  Medication Sig  . amoxicillin-clavulanate (AUGMENTIN) 875-125 MG tablet Take 1 tablet by mouth 2 (two) times daily.   No facility-administered medications prior to visit.    Review of Systems  Constitutional: Negative.   Respiratory: Negative.   Cardiovascular: Negative.   Musculoskeletal: Negative.     {Heme  Chem  Endocrine  Serology  Results Review (optional):23779::" "}  Objective    There were no vitals taken for this visit. {Show previous vital signs (optional):23777::" "}  Physical Exam   General: Appearance:    {Habitus:[phone]} female in no acute distress  Eyes:    PERRL, conjunctiva/corneas clear, EOM's intact       Lungs:     Clear to auscultation bilaterally, respirations unlabored  Heart:    Normal heart rate. Normal rhythm. No murmurs, rubs, or gallops.   MS:   All extremities are intact.   Neurologic:   Awake, alert,  oriented x 3. No apparent focal neurological           defect.       No results found for any visits on 09/14/19.  Assessment & Plan     ***  No follow-ups on file.      {provider attestation***:1}   Jairo Ben, FNP  [facility] [phone] (phone) [phone] (fax)  [facility]

## 2019-09-14 ENCOUNTER — Other Ambulatory Visit: Payer: Self-pay

## 2019-09-14 ENCOUNTER — Telehealth: Payer: Self-pay

## 2019-09-14 ENCOUNTER — Ambulatory Visit: Payer: Self-pay | Admitting: Adult Health

## 2019-09-14 ENCOUNTER — Ambulatory Visit
Admission: RE | Admit: 2019-09-14 | Discharge: 2019-09-14 | Disposition: A | Discharge status: Home / Self Care | Payer: BC Managed Care – PPO | Source: Home / Self Care | Attending: Adult Health | Admitting: Adult Health

## 2019-09-14 ENCOUNTER — Ambulatory Visit
Admission: RE | Admit: 2019-09-14 | Discharge: 2019-09-14 | Disposition: A | Discharge status: Home / Self Care | Payer: BC Managed Care – PPO | Source: Ambulatory Visit | Attending: Adult Health | Admitting: Adult Health

## 2019-09-14 ENCOUNTER — Other Ambulatory Visit: Payer: Self-pay | Admitting: Adult Health

## 2019-09-14 DIAGNOSIS — M79645 Pain in left finger(s): Secondary | ICD-10-CM | POA: Insufficient documentation

## 2019-09-14 DIAGNOSIS — M79641 Pain in right hand: Secondary | ICD-10-CM | POA: Insufficient documentation

## 2019-09-14 DIAGNOSIS — R5383 Other fatigue: Secondary | ICD-10-CM | POA: Diagnosis not present

## 2019-09-14 DIAGNOSIS — R768 Other specified abnormal immunological findings in serum: Secondary | ICD-10-CM | POA: Insufficient documentation

## 2019-09-14 DIAGNOSIS — M79642 Pain in left hand: Secondary | ICD-10-CM | POA: Diagnosis not present

## 2019-09-14 DIAGNOSIS — R0602 Shortness of breath: Secondary | ICD-10-CM | POA: Insufficient documentation

## 2019-09-14 DIAGNOSIS — R079 Chest pain, unspecified: Secondary | ICD-10-CM | POA: Diagnosis not present

## 2019-09-14 DIAGNOSIS — R918 Other nonspecific abnormal finding of lung field: Secondary | ICD-10-CM | POA: Diagnosis not present

## 2019-09-14 DIAGNOSIS — Z8616 Personal history of COVID-19: Secondary | ICD-10-CM | POA: Insufficient documentation

## 2019-09-14 MED ORDER — IOHEXOL 350 MG/ML SOLN
75.0000 mL | Freq: Once | INTRAVENOUS | Status: AC | PRN
  Administered 2019-09-14: 75 mL via INTRAVENOUS

## 2019-09-14 NOTE — Telephone Encounter (Signed)
Patient advised. She does not want to proceed with additional testing if not needed. She would like to call back after vacation if she decides to proceed with Echo.

## 2019-09-14 NOTE — Telephone Encounter (Signed)
Attempted to contact patient, no answer left a voicemail advising patient that orders have been placed and she should receive a call soon.

## 2019-09-14 NOTE — Telephone Encounter (Signed)
-----   Message from Berniece Pap, FNP sent at 09/14/2019  1:14 PM EDT ----- CT angiogram reassuring and negative for any pulmonary embolus/ clot. Lungs are clear without any abnormality. Aorta appears normal. No acute abnormalities is within normal limits.   Will order echocardiogram no cause there for shortness of breath as discussed if patient is willing to proceed. Just let me know.

## 2019-09-14 NOTE — Progress Notes (Signed)
CT angiogram reassuring and negative for any pulmonary embolus/ clot. Lungs are clear without any abnormality. Aorta appears normal. No acute abnormalities is within normal limits.   Will order echocardiogram no cause there for shortness of breath as discussed if patient is willing to proceed. Just let me know.

## 2019-09-14 NOTE — Progress Notes (Signed)
Spoke with peer to peer her insurance company for CTA approval- authorization approved [id number] Valid for 7/1-12/27/21 at [facility] . Reported to Maye Hides in referrals.

## 2019-09-15 NOTE — Progress Notes (Signed)
Bilateral hand x rays are within normal limits. No signs of trauma or arthritis currently. Try carpal tunnel braces over the counter and follow up as needed.

## 2019-09-20 ENCOUNTER — Ambulatory Visit: Payer: BC Managed Care – PPO

## 2019-11-03 ENCOUNTER — Telehealth: Payer: Self-pay

## 2019-11-03 NOTE — Telephone Encounter (Signed)
Copied from CRM [id number]. Topic: Referral - Status >> Nov 03, 2019  1:11 PM Leafy Ro wrote: Reason for CRM: pt is returning sarah call concerning a referral.

## 2019-12-06 DIAGNOSIS — L821 Other seborrheic keratosis: Secondary | ICD-10-CM | POA: Diagnosis not present

## 2019-12-06 DIAGNOSIS — D239 Other benign neoplasm of skin, unspecified: Secondary | ICD-10-CM | POA: Diagnosis not present

## 2021-12-18 IMAGING — CR DG HAND COMPLETE 3+V*R*
3 series · 3 of 3 positions shown · non-contrast
Comparison: None

CLINICAL DATA: Hand pain, rheumatoid positive, pain along the
medial and along the fifth digit

EXAM:
RIGHT HAND - COMPLETE 3+ VIEW

[hand ap]
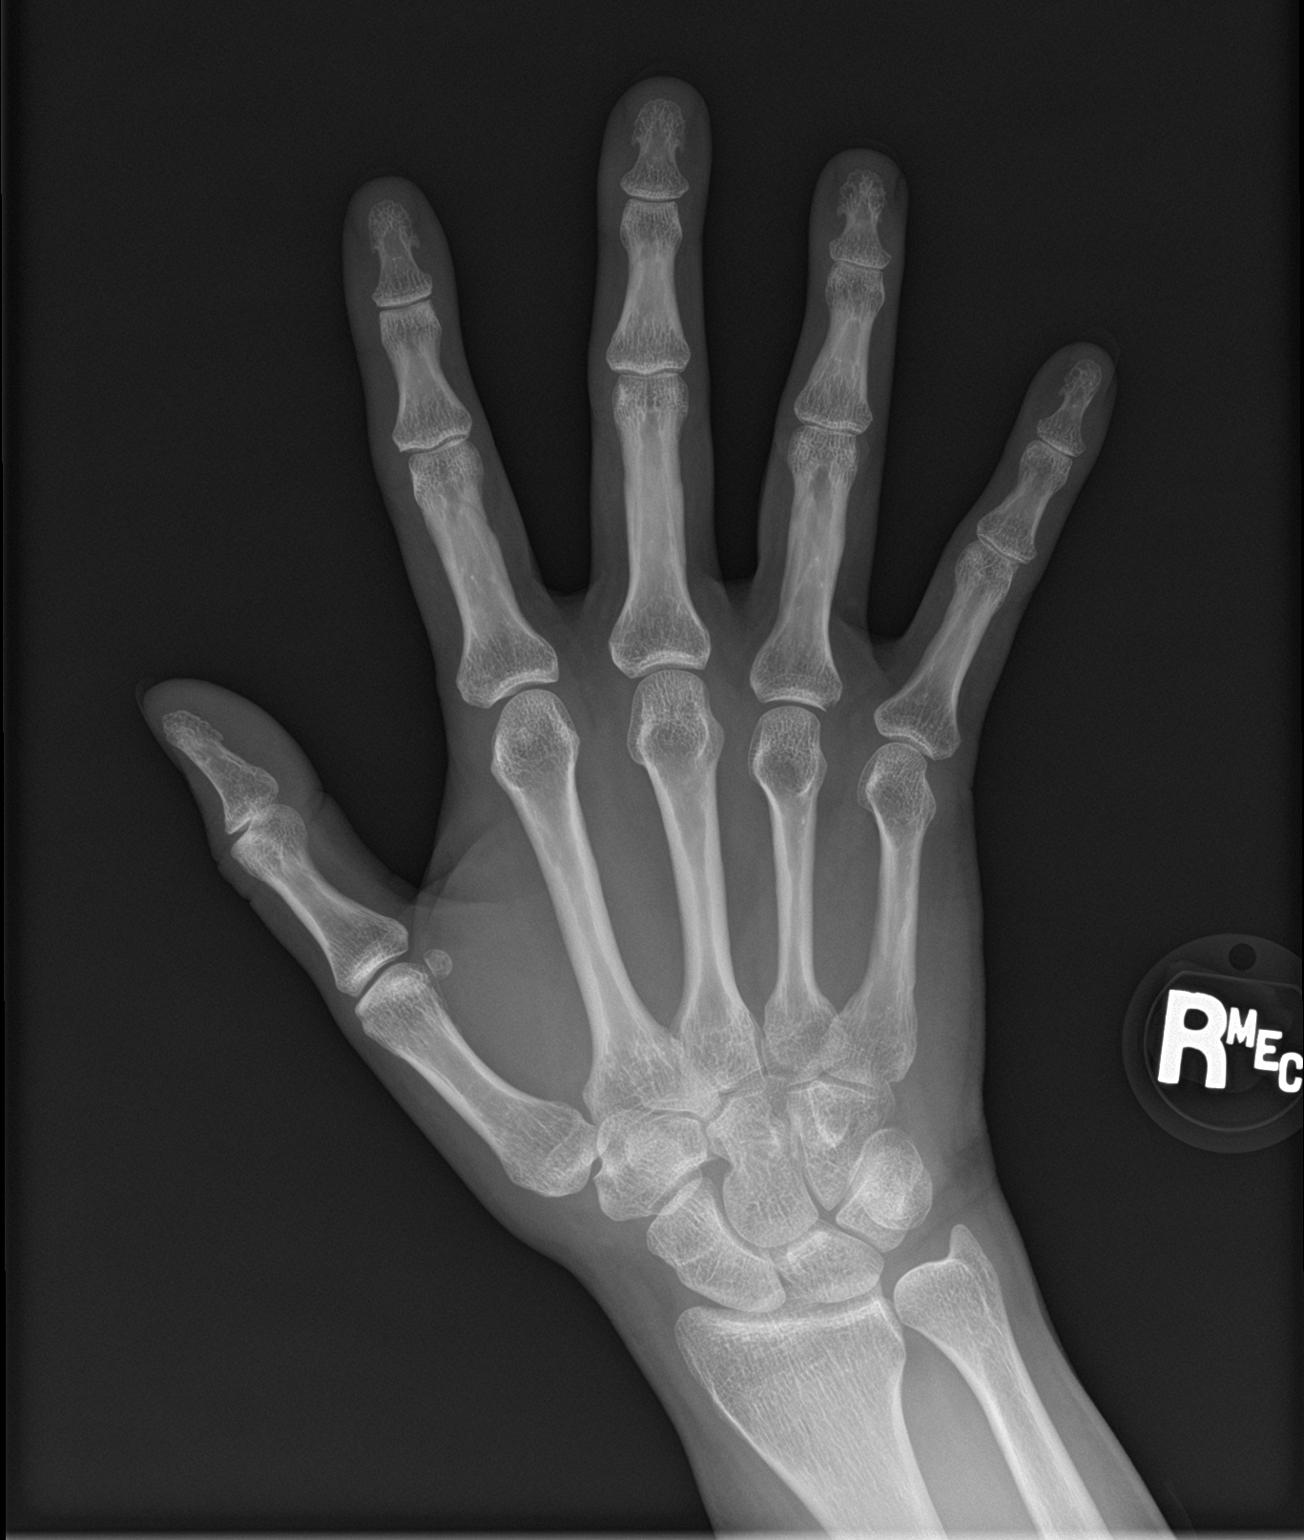

[hand obl]
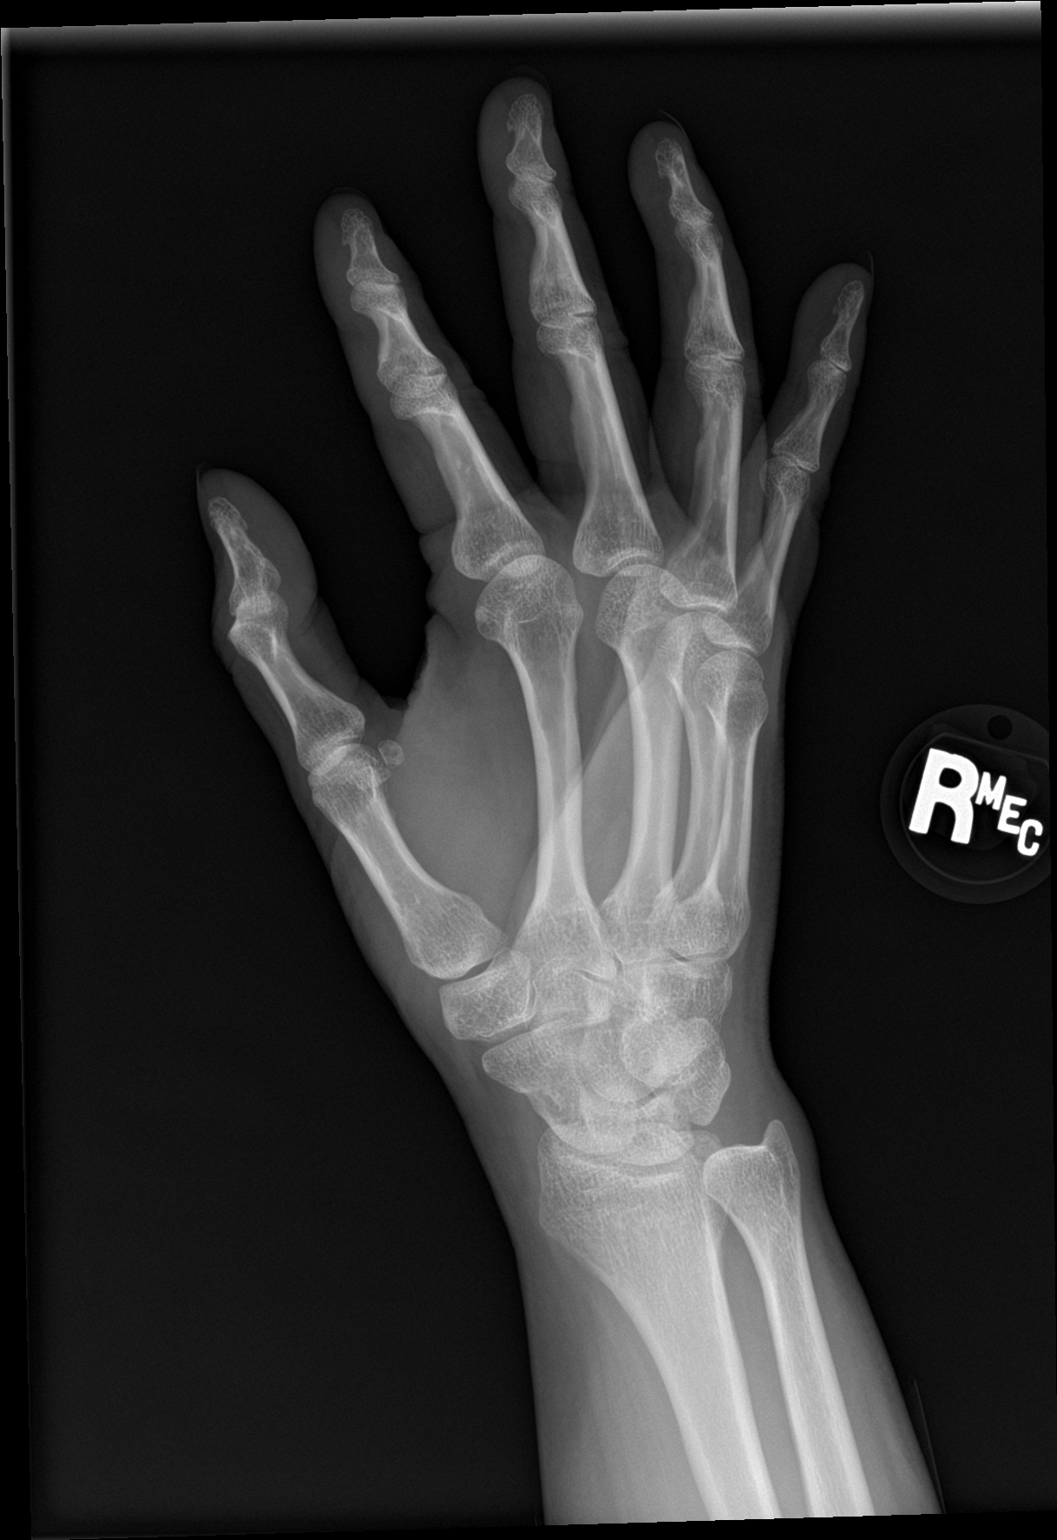

[hand lat]
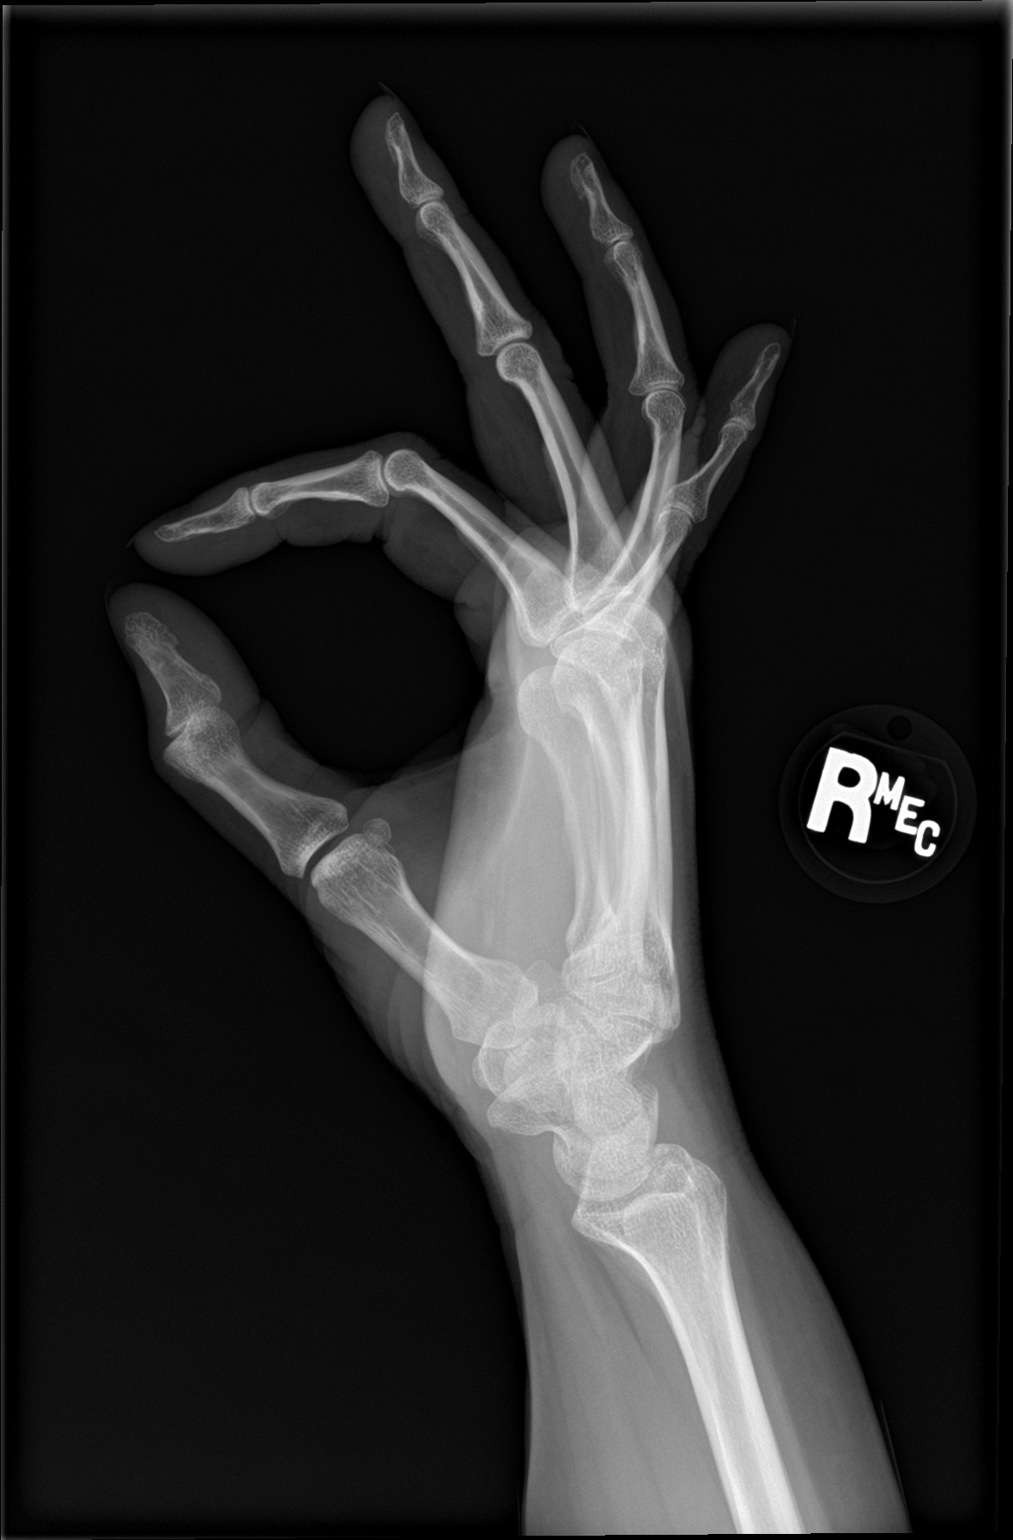

[3 of 3 positions shown; findings below may reference images not displayed]

FINDINGS: There is no evidence of fracture or dislocation. There is no
evidence of underlying arthropathy, age advanced arthrosis or other
focal bone abnormality. Soft tissues are unremarkable.
IMPRESSION: Negative.

## 2023-02-03 ENCOUNTER — Other Ambulatory Visit: Payer: Self-pay | Admitting: Emergency Medicine

## 2023-02-03 DIAGNOSIS — R609 Edema, unspecified: Secondary | ICD-10-CM

## 2023-02-16 ENCOUNTER — Other Ambulatory Visit: Payer: BC Managed Care – PPO

## 2023-02-25 ENCOUNTER — Ambulatory Visit
Admission: RE | Admit: 2023-02-25 | Discharge: 2023-02-25 | Disposition: A | Discharge status: Home / Self Care | Payer: No Typology Code available for payment source | Source: Ambulatory Visit | Attending: Emergency Medicine | Admitting: Emergency Medicine

## 2023-02-25 DIAGNOSIS — R609 Edema, unspecified: Secondary | ICD-10-CM
